# Patient Record
Sex: Male | Born: 1955 | Race: Black or African American | Hispanic: No | Marital: Married | Smoking: Never smoker
Health system: Southern US, Community
[De-identification: ages and names within clinical notes are randomized; demographics above are authoritative.]

---

## 2009-02-07 ENCOUNTER — Emergency Department (HOSPITAL_BASED_OUTPATIENT_CLINIC_OR_DEPARTMENT_OTHER): Admission: EM | Admit: 2009-02-07 | Discharge: 2009-02-07 | Payer: Self-pay | Admitting: Emergency Medicine

## 2015-12-16 ENCOUNTER — Emergency Department (HOSPITAL_COMMUNITY): Payer: Medicaid Other

## 2015-12-16 ENCOUNTER — Encounter (HOSPITAL_COMMUNITY): Payer: Self-pay

## 2015-12-16 ENCOUNTER — Emergency Department (HOSPITAL_COMMUNITY)
Admission: EM | Admit: 2015-12-16 | Discharge: 2015-12-16 | Disposition: A | Discharge status: Home / Self Care | Payer: Medicaid Other | Attending: Emergency Medicine | Admitting: Emergency Medicine

## 2015-12-16 DIAGNOSIS — Z79899 Other long term (current) drug therapy: Secondary | ICD-10-CM | POA: Diagnosis not present

## 2015-12-16 DIAGNOSIS — R079 Chest pain, unspecified: Secondary | ICD-10-CM | POA: Diagnosis present

## 2015-12-16 DIAGNOSIS — R0789 Other chest pain: Secondary | ICD-10-CM | POA: Diagnosis not present

## 2015-12-16 DIAGNOSIS — F1010 Alcohol abuse, uncomplicated: Secondary | ICD-10-CM | POA: Diagnosis not present

## 2015-12-16 DIAGNOSIS — R1031 Right lower quadrant pain: Secondary | ICD-10-CM | POA: Diagnosis not present

## 2015-12-16 LAB — RAPID URINE DRUG SCREEN, HOSP PERFORMED
Amphetamines: NOT DETECTED
BARBITURATES: NOT DETECTED
BENZODIAZEPINES: NOT DETECTED
COCAINE: NOT DETECTED
Opiates: NOT DETECTED
TETRAHYDROCANNABINOL: POSITIVE — AB

## 2015-12-16 LAB — ETHANOL: Alcohol, Ethyl (B): 225 mg/dL — ABNORMAL HIGH (ref <5)

## 2015-12-16 LAB — URINALYSIS, ROUTINE W REFLEX MICROSCOPIC
Bilirubin Urine: NEGATIVE
GLUCOSE, UA: NEGATIVE mg/dL
HGB URINE DIPSTICK: NEGATIVE
Ketones, ur: NEGATIVE mg/dL
Leukocytes, UA: NEGATIVE
Nitrite: NEGATIVE
PROTEIN: NEGATIVE mg/dL
Specific Gravity, Urine: 1.013 (ref 1.005–1.030)
pH: 5 (ref 5.0–8.0)

## 2015-12-16 LAB — BASIC METABOLIC PANEL
ANION GAP: 14 (ref 5–15)
BUN: 10 mg/dL (ref 6–20)
CO2: 16 mmol/L — ABNORMAL LOW (ref 22–32)
Calcium: 7.8 mg/dL — ABNORMAL LOW (ref 8.9–10.3)
Chloride: 110 mmol/L (ref 101–111)
Creatinine, Ser: 1.01 mg/dL (ref 0.61–1.24)
GFR calc Af Amer: >60 mL/min (ref >60)
Glucose, Bld: 83 mg/dL (ref 65–99)
POTASSIUM: 4.1 mmol/L (ref 3.5–5.1)
SODIUM: 140 mmol/L (ref 135–145)

## 2015-12-16 LAB — HEPATIC FUNCTION PANEL
ALT: 95 U/L — AB (ref 17–63)
AST: 149 U/L — AB (ref 15–41)
Albumin: 3.3 g/dL — ABNORMAL LOW (ref 3.5–5.0)
Alkaline Phosphatase: 52 U/L (ref 38–126)
BILIRUBIN INDIRECT: 0.2 mg/dL — AB (ref 0.3–0.9)
Bilirubin, Direct: 0.4 mg/dL (ref 0.1–0.5)
TOTAL PROTEIN: 6.4 g/dL — AB (ref 6.5–8.1)
Total Bilirubin: 0.6 mg/dL (ref 0.3–1.2)

## 2015-12-16 LAB — I-STAT TROPONIN, ED
Troponin i, poc: 0 ng/mL (ref 0.00–0.08)
Troponin i, poc: 0 ng/mL (ref 0.00–0.08)

## 2015-12-16 LAB — CBC
HEMATOCRIT: 42.7 % (ref 39.0–52.0)
HEMOGLOBIN: 14.6 g/dL (ref 13.0–17.0)
MCH: 32.2 pg (ref 26.0–34.0)
MCHC: 34.2 g/dL (ref 30.0–36.0)
MCV: 94.3 fL (ref 78.0–100.0)
Platelets: 158 10*3/uL (ref 150–400)
RBC: 4.53 MIL/uL (ref 4.22–5.81)
RDW: 12.8 % (ref 11.5–15.5)
WBC: 6.3 10*3/uL (ref 4.0–10.5)

## 2015-12-16 LAB — LIPASE, BLOOD: Lipase: 21 U/L (ref 11–51)

## 2015-12-16 MED ORDER — SODIUM CHLORIDE 0.9 % IV BOLUS (SEPSIS)
1000.0000 mL | Freq: Once | INTRAVENOUS | Status: AC
  Administered 2015-12-16: 1000 mL via INTRAVENOUS

## 2015-12-16 MED ORDER — IOPAMIDOL (ISOVUE-300) INJECTION 61%
INTRAVENOUS | Status: AC
Start: 2015-12-16 — End: 2015-12-16
  Administered 2015-12-16: 100 mL
  Filled 2015-12-16: qty 100

## 2015-12-16 MED ORDER — ONDANSETRON HCL 4 MG/2ML IJ SOLN
4.0000 mg | Freq: Once | INTRAMUSCULAR | Status: AC
  Administered 2015-12-16: 4 mg via INTRAVENOUS
  Filled 2015-12-16: qty 2

## 2015-12-16 MED ORDER — SODIUM CHLORIDE 0.9 % IV BOLUS (SEPSIS)
500.0000 mL | Freq: Once | INTRAVENOUS | Status: AC
  Administered 2015-12-16: 500 mL via INTRAVENOUS

## 2015-12-16 NOTE — Discharge Instructions (Signed)
Drink plenty of fluids. Try to stop drinking alcohol. Look at the outpatient referrals to get help to stop drinking.

## 2015-12-16 NOTE — ED Notes (Signed)
Patient transported to CT 

## 2015-12-16 NOTE — ED Notes (Signed)
Called main lab to add on hfp and lipase.

## 2015-12-16 NOTE — ED Triage Notes (Addendum)
Pt brought in by [facility]. Pt is ETOH with c/o abdominal pain, chest pain, and nausea vomiting. Pt is alert and responding to some questions. Family reports pt also smoking marijuana. Per family pt started complaining of pain at about 0000.

## 2015-12-16 NOTE — ED Provider Notes (Signed)
[facility]-EMERGENCY DEPT Provider Note   CSN: [id number] Arrival date & time: 12/16/15  0207  By signing my name below, I, Linus GalasMaharshi Patel, attest that this documentation has been prepared under the direction and in the presence of Devoria AlbeIva Tyvion Edmondson, MD. Electronically Signed: Linus GalasMaharshi Patel, ED Scribe. 12/16/15. 2:37 AM.  Time seen 02:37 AM  History   Chief Complaint Chief Complaint  Patient presents with  . Abdominal Pain  . Chest Pain   The history is provided by the patient. No language interpreter was used.   Level 5 caveat for alcohol intoxication  HPI Comments: Austin Hensley is a 60 y.o. male who presents to the Emergency Department complaining of chest pain with associated SOB that began 3 hours ago around [place]. Wife also reports mid-abdominal pain, urinating on himself and 4 episodes of vomiting. As per wife, while the pt was lying down in bed, he began complaining of CP and SOB. She states it seemed like the pt was gasping for air. Wife suspects the pt had too much alcohol to drink tonight. He usually drinks 12 beers and 1 bottle of wine daily. He also smoked marijuana today.  Wife denies any fevers, cough,chills, diarrhea, HA, dizziness, or any other symptoms at this time. He has no hx of prior abdominal surgeries. Wife states they discussed detox once about a  Year ago and the patient refused to go. Pt states tonight "If I survive I am going to quit today".    PCP  OSEI-BONSU,GEORGE, MD  History reviewed. No pertinent past medical history.  There are no active problems to display for this patient.  History reviewed. No pertinent surgical history.  Home Medications    Prior to Admission medications   Not on File   Family History History reviewed. No pertinent family history.  Social History Social History  Substance Use Topics  . Smoking status: Never Smoker  . Smokeless tobacco: Never Used  . Alcohol use Not on file  Pt does yard work on the side. Drinks 12 beers + 1  bottle wine daily unemployed  Allergies   Patient has no known allergies.  Review of Systems Review of Systems  Constitutional: Negative for chills and fever.  Respiratory: Positive for shortness of breath.   Cardiovascular: Positive for chest pain.  Gastrointestinal: Positive for vomiting. Negative for diarrhea.  Neurological: Negative for dizziness and headaches.  All other systems reviewed and are negative.  Physical Exam Updated Vital Signs BP 130/86 (BP Location: Right Arm)   Pulse 70   Resp 18   SpO2 95%   Vital signs normal    Physical Exam  Constitutional: He is oriented to person, place, and time. He appears well-developed and well-nourished.  Non-toxic appearance. He does not appear ill. No distress.  somnolent   HENT:  Head: Normocephalic and atraumatic.  Right Ear: External ear normal.  Left Ear: External ear normal.  Nose: Nose normal. No mucosal edema or rhinorrhea.  Mouth/Throat: Oropharynx is clear and moist and mucous membranes are normal. No dental abscesses or uvula swelling.  Eyes: Conjunctivae and EOM are normal. Pupils are equal, round, and reactive to light.  Neck: Normal range of motion and full passive range of motion without pain. Neck supple.  Cardiovascular: Normal rate, regular rhythm and normal heart sounds.  Exam reveals no gallop and no friction rub.   No murmur heard. Pulmonary/Chest: Effort normal and breath sounds normal. No respiratory distress. He has no wheezes. He has no rhonchi. He has no rales. He  exhibits no tenderness and no crepitus.  Abdominal: Soft. Normal appearance and bowel sounds are normal. He exhibits no distension. There is tenderness in the right lower quadrant. There is no rebound and no guarding.    Musculoskeletal: Normal range of motion. He exhibits no edema or tenderness.  Moves all extremities well.   Neurological: He is alert and oriented to person, place, and time. He has normal strength. No cranial nerve  deficit.  Skin: Skin is warm, dry and intact. No rash noted. No erythema. No pallor.  Psychiatric: He has a normal mood and affect. His mood appears not anxious. His speech is delayed. He is slowed.  Nursing note and vitals reviewed.  ED Treatments / Results  DIAGNOSTIC STUDIES: Oxygen Saturation is 95% on room air, normal by my interpretation.    COORDINATION OF CARE: 2:50 AM Discussed treatment plan with pt at bedside and pt agreed to plan.  Labs (all labs ordered are listed, but only abnormal results are displayed) Results for orders placed or performed during the hospital encounter of 12/16/15  Basic metabolic panel  Result Value Ref Range   Sodium 140 135 - 145 mmol/L   Potassium 4.1 3.5 - 5.1 mmol/L   Chloride 110 101 - 111 mmol/L   CO2 16 (L) 22 - 32 mmol/L   Glucose, Bld 83 65 - 99 mg/dL   BUN 10 6 - 20 mg/dL   Creatinine, Ser 1.611.01 0.61 - 1.24 mg/dL   Calcium 7.8 (L) 8.9 - 10.3 mg/dL   GFR calc non Af Amer >60 >60 mL/min   GFR calc Af Amer >60 >60 mL/min   Anion gap 14 5 - 15  CBC  Result Value Ref Range   WBC 6.3 4.0 - 10.5 K/uL   RBC 4.53 4.22 - 5.81 MIL/uL   Hemoglobin 14.6 13.0 - 17.0 g/dL   HCT 09.642.7 04.539.0 - 40.952.0 %   MCV 94.3 78.0 - 100.0 fL   MCH 32.2 26.0 - 34.0 pg   MCHC 34.2 30.0 - 36.0 g/dL   RDW 81.112.8 91.411.5 - 78.215.5 %   Platelets 158 150 - 400 K/uL  Hepatic function panel  Result Value Ref Range   Total Protein 6.4 (L) 6.5 - 8.1 g/dL   Albumin 3.3 (L) 3.5 - 5.0 g/dL   AST 956149 (H) 15 - 41 U/L   ALT 95 (H) 17 - 63 U/L   Alkaline Phosphatase 52 38 - 126 U/L   Total Bilirubin 0.6 0.3 - 1.2 mg/dL   Bilirubin, Direct 0.4 0.1 - 0.5 mg/dL   Indirect Bilirubin 0.2 (L) 0.3 - 0.9 mg/dL  Ethanol  Result Value Ref Range   Alcohol, Ethyl (B) 225 (H) <5 mg/dL  Urine rapid drug screen (hosp performed)  Result Value Ref Range   Opiates NONE DETECTED NONE DETECTED   Cocaine NONE DETECTED NONE DETECTED   Benzodiazepines NONE DETECTED NONE DETECTED   Amphetamines  NONE DETECTED NONE DETECTED   Tetrahydrocannabinol POSITIVE (A) NONE DETECTED   Barbiturates NONE DETECTED NONE DETECTED  Urinalysis, Routine w reflex microscopic  Result Value Ref Range   Color, Urine YELLOW YELLOW   APPearance CLEAR CLEAR   Specific Gravity, Urine 1.013 1.005 - 1.030   pH 5.0 5.0 - 8.0   Glucose, UA NEGATIVE NEGATIVE mg/dL   Hgb urine dipstick NEGATIVE NEGATIVE   Bilirubin Urine NEGATIVE NEGATIVE   Ketones, ur NEGATIVE NEGATIVE mg/dL   Protein, ur NEGATIVE NEGATIVE mg/dL   Nitrite NEGATIVE NEGATIVE   Leukocytes, UA  NEGATIVE NEGATIVE  Lipase, blood  Result Value Ref Range   Lipase 21 11 - 51 U/L  I-stat troponin, ED  Result Value Ref Range   Troponin i, poc 0.00 0.00 - 0.08 ng/mL   Comment 3          I-stat troponin, ED  Result Value Ref Range   Troponin i, poc 0.00 0.00 - 0.08 ng/mL   Comment 3           Laboratory interpretation all normal except Alcohol intoxication, elevated LFTs consistent with alcohol use    EKG  EKG Interpretation  Date/Time:  Wednesday December 16 2015 02:19:40 EST Ventricular Rate:  59 PR Interval:    QRS Duration: 111 QT Interval:  464 QTC Calculation: 460 R Axis:   82 Text Interpretation:  Sinus rhythm Probable left ventricular hypertrophy early repolarization No old tracing to compare Confirmed by Shylin Keizer  MD-I, Adalina Dopson ([id number]) on 12/16/2015 2:27:16 AM       Radiology Dg Chest 2 View  Result Date: 12/16/2015 CLINICAL DATA:  Mid chest pain and shortness of breath EXAM: CHEST  2 VIEW COMPARISON:  None. FINDINGS: The heart size and mediastinal contours are within normal limits. Both lungs are clear. The visualized skeletal structures are unremarkable. IMPRESSION: No active cardiopulmonary disease. Electronically Signed   By: Jasmine Pang M.D.   On: 12/16/2015 03:05   Ct Abdomen Pelvis W Contrast  Result Date: 12/16/2015 CLINICAL DATA:  Acute onset of right lower quadrant abdominal pain. Initial encounter. EXAM: CT ABDOMEN  AND PELVIS WITH CONTRAST TECHNIQUE: Multidetector CT imaging of the abdomen and pelvis was performed using the standard protocol following bolus administration of intravenous contrast. CONTRAST:  ISOVUE-300 IOPAMIDOL (ISOVUE-300) INJECTION 61% COMPARISON:  None. FINDINGS: Lower chest: Mild bibasilar atelectasis is noted. The visualized portions of the mediastinum are unremarkable. Hepatobiliary: The liver is unremarkable in appearance. The gallbladder is unremarkable in appearance. The common bile duct remains normal in caliber. Nodes near the porta hepatis measure up to 1.5 cm in short axis. Pancreas: The pancreas is within normal limits. Spleen: The spleen is unremarkable in appearance. Adrenals/Urinary Tract: The adrenal glands are unremarkable in appearance. The kidneys are within normal limits. There is no evidence of hydronephrosis. No renal or ureteral stones are identified. No perinephric stranding is seen. Stomach/Bowel: The stomach is unremarkable in appearance. The small bowel is within normal limits. The appendix is normal in caliber, without evidence of appendicitis. The colon is unremarkable in appearance. Vascular/Lymphatic: Minimal calcification is noted at the distal abdominal aorta. There is mild ectasia of the common iliac arteries bilaterally. No retroperitoneal or pelvic sidewall lymphadenopathy is seen. Prominent vasculature is noted extending about the prostate and base of the bladder. Reproductive: The bladder is significantly distended and grossly unremarkable. The prostate is borderline normal in size, with scattered calcification. Other: No additional soft tissue abnormalities are seen. Musculoskeletal: No acute osseous abnormalities are identified. There is mild grade 1 anterolisthesis of L2 on L3. Slight chronic loss of height is noted along the lower lumbar spine, with underlying vacuum phenomenon. The visualized musculature is unremarkable in appearance. IMPRESSION: 1. No acute  abnormality seen to explain the patient's symptoms. 2. Nodes near the porta hepatis measure up to 1.5 cm in short axis, of uncertain significance. 3. Prominent vasculature incidentally noted extending about the prostate and base of the bladder. 4. Mild bibasilar atelectasis noted. Electronically Signed   By: Roanna Raider M.D.   On: 12/16/2015 05:17    Procedures  Procedures (including critical care time)  Medications Ordered in ED Medications  sodium chloride 0.9 % bolus 1,000 mL (0 mLs Intravenous Stopped 12/16/15 0445)  sodium chloride 0.9 % bolus 500 mL (0 mLs Intravenous Stopped 12/16/15 0322)  ondansetron (ZOFRAN) injection 4 mg (4 mg Intravenous Given 12/16/15 0311)  iopamidol (ISOVUE-300) 61 % injection (100 mLs  Contrast Given 12/16/15 0450)     Initial Impression / Assessment and Plan / ED Course  I have reviewed the triage vital signs and the nursing notes.  Pertinent labs & imaging results that were available during my care of the patient were reviewed by me and considered in my medical decision making (see chart for details).  Clinical Course    Patient was given IV fluids. He was given IV Zofran for nausea. Laboratory testing was ordered. We discussed his abdominal pain and I will recheck him in a little while to see if his pain is persistent.   04:25 AM reexam, patient sleeping, when awakened, still has abdominal pain. On exam he has RLQ pain to palpation. Discussed getting AP CT scan to look for appendicitis/colitis.  5 AM recheck patient was given results of his CT scan we discussed getting a delta troponin and if it's negative he can be discharged home.  6:30 AM patient's delta troponin is negative. He was discharged home. He was given outpatient referral for alcohol abuse in case he is serious about stopping drinking. Due to his heavy alcohol use he was not given any specific medications for pain.  Final Clinical Impressions(s) / ED Diagnoses   Final diagnoses:    Alcohol abuse  Right lower quadrant abdominal pain  Chest pain, atypical    Plan discharge  Devoria Albe, MD, FACEP   I personally performed the services described in this documentation, which was scribed in my presence. The recorded information has been reviewed and considered.  Devoria Albe, MD, Concha Pyo, MD 12/16/15 [phone]

## 2017-10-28 IMAGING — CT CT ABD-PELV W/ CM
2 of 5 series · 16 of 46 positions shown, 18 images · IV contrast (Omni 300)
Comparison: None.

CLINICAL DATA: Acute onset of right lower quadrant abdominal pain.
Initial encounter.

EXAM:
CT ABDOMEN AND PELVIS WITH CONTRAST
TECHNIQUE: Multidetector CT imaging of the abdomen and pelvis was performed
using the standard protocol following bolus administration of
intravenous contrast.
CONTRAST:  100mL GAHTIJ-K33 IOPAMIDOL (GAHTIJ-K33) INJECTION 61%

[Series 2: a/p w/ 5mm · axial · 0.77mm/px · z∈[-461,-76]mm · 13 of 87 slices shown, 15 images]
[im 5/87  soft-tissue]
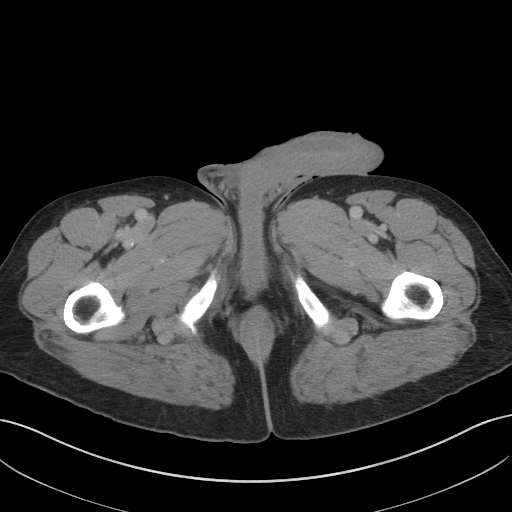
[im 5/87  bone]
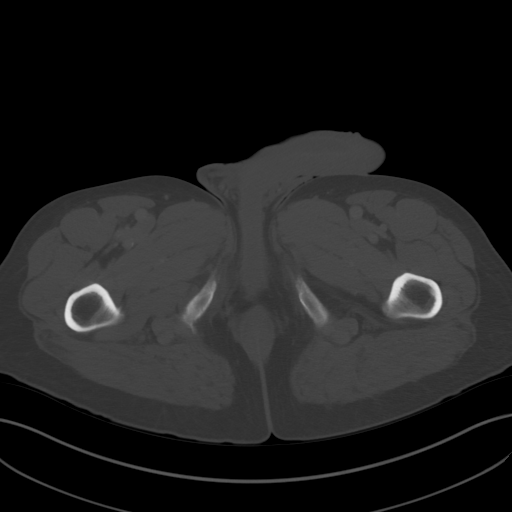
[im 10/87  soft-tissue]
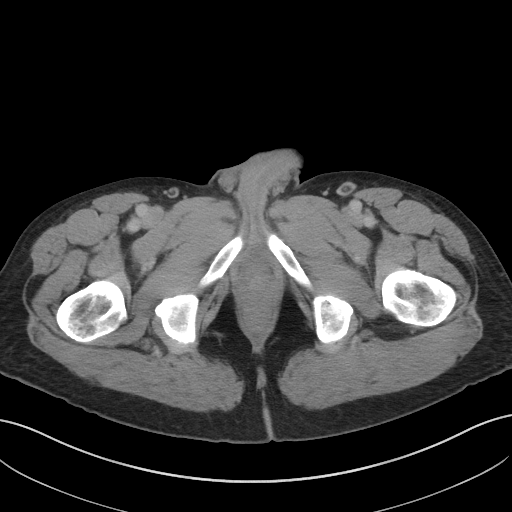
[im 20/87  soft-tissue]
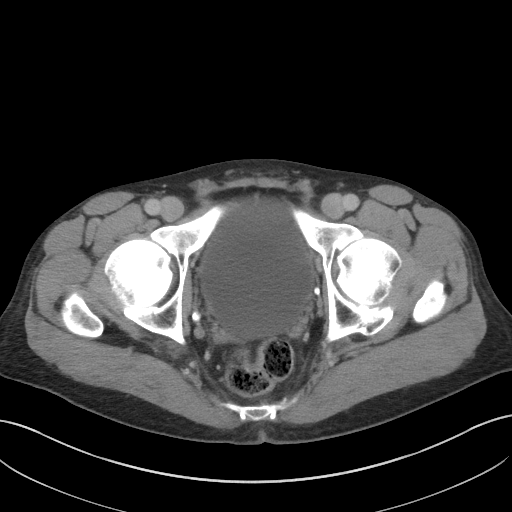
[im 24/87  soft-tissue]
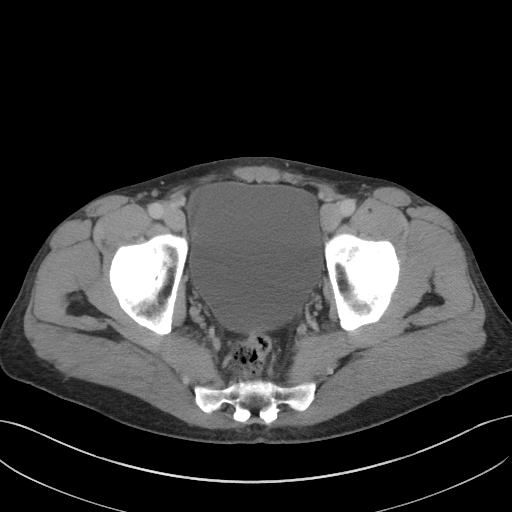
[im 29/87  soft-tissue]
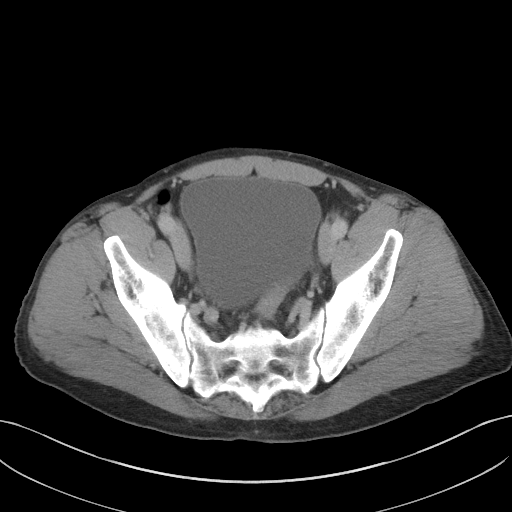
[im 39/87  soft-tissue]
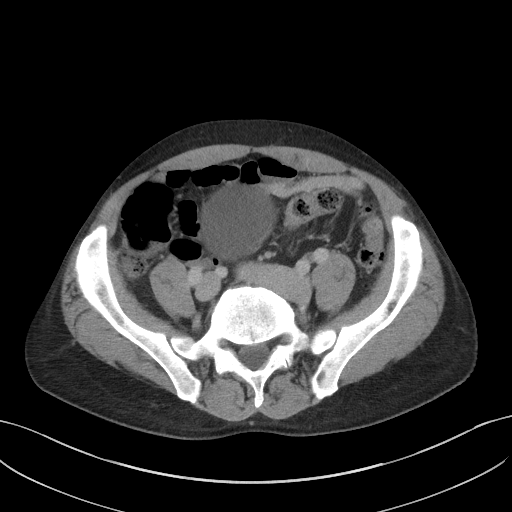
[im 44/87  soft-tissue]
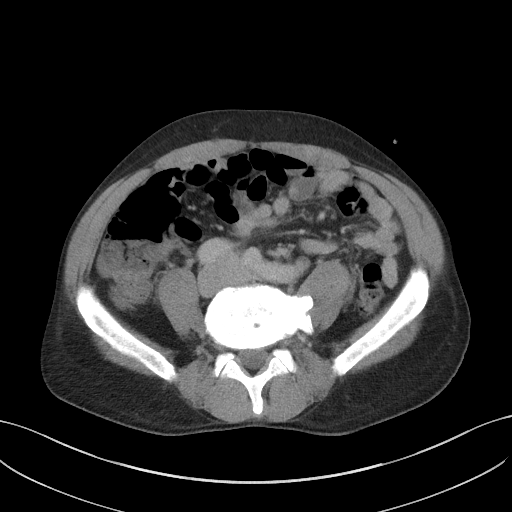
[im 48/87  soft-tissue]
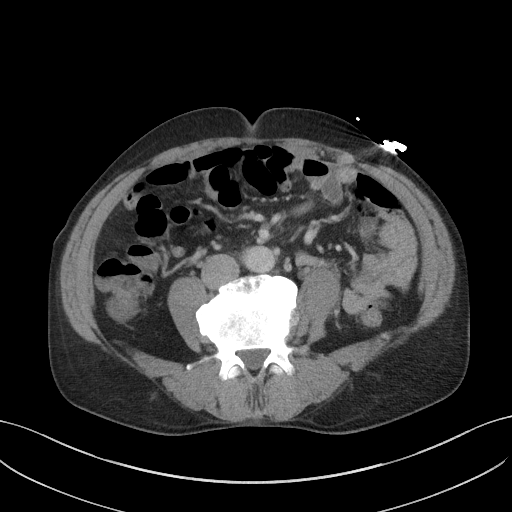
[im 58/87  soft-tissue]
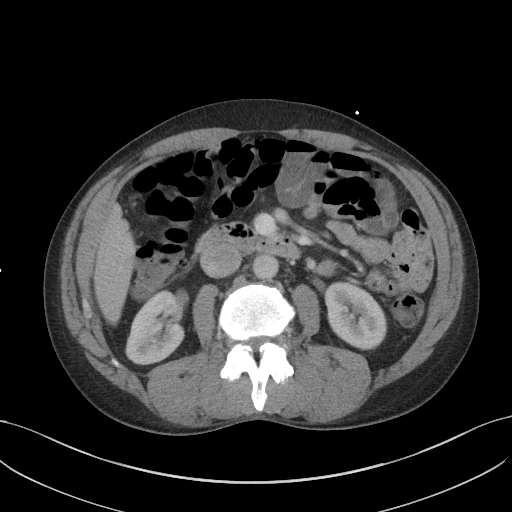
[im 58/87  bone]
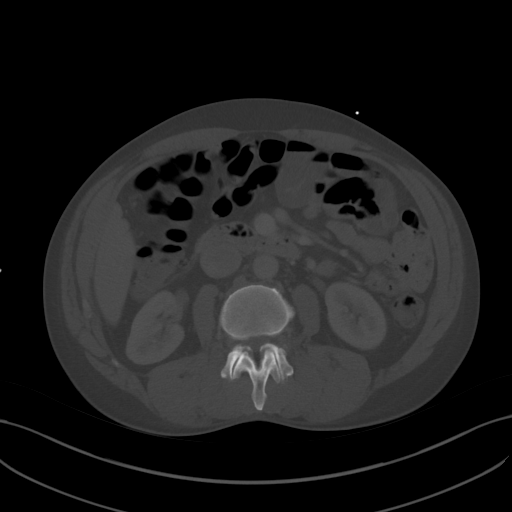
[im 63/87  soft-tissue]
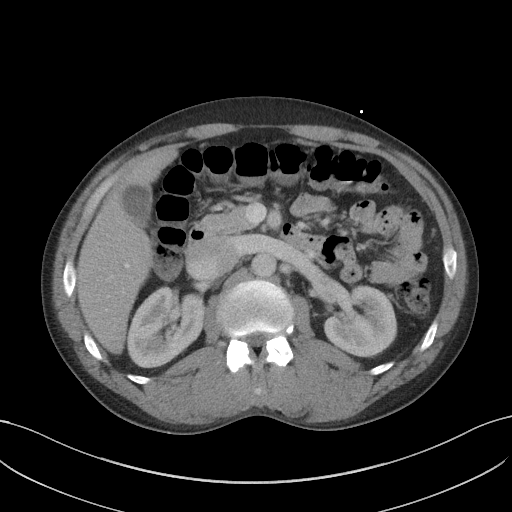
[im 67/87  soft-tissue]
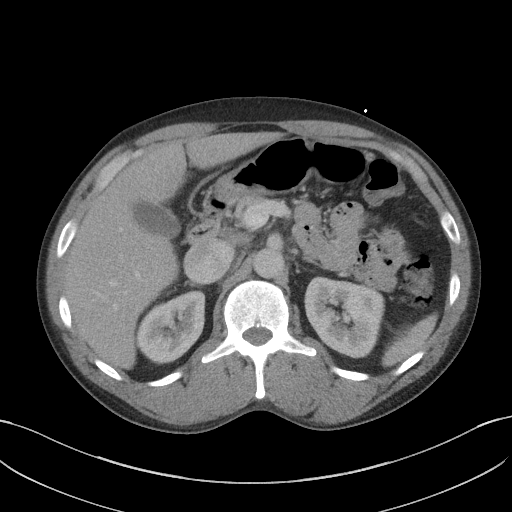
[im 77/87  soft-tissue]
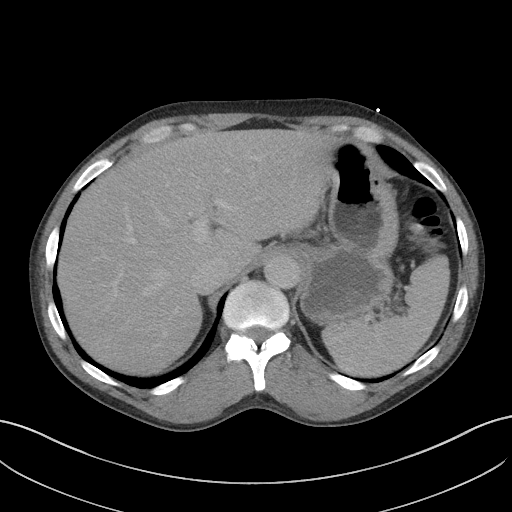
[im 82/87  soft-tissue]
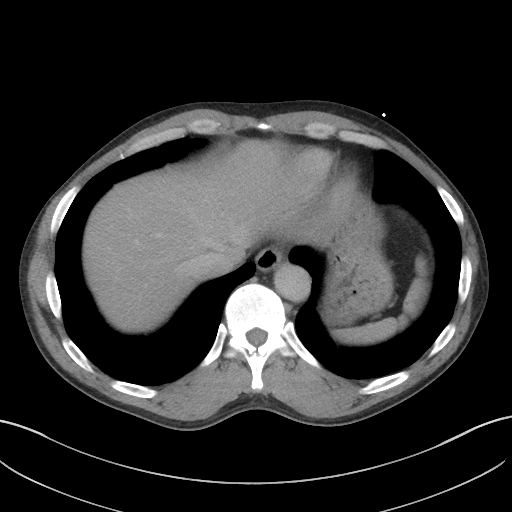

[Series 5: a/p w/ cor · coronal · 0.75mm/px · 3 of 122 slices shown]
[im 41/122  soft-tissue]
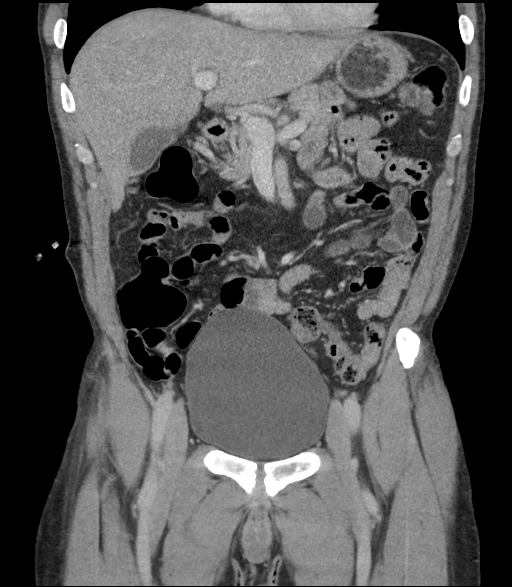
[im 54/122  soft-tissue]
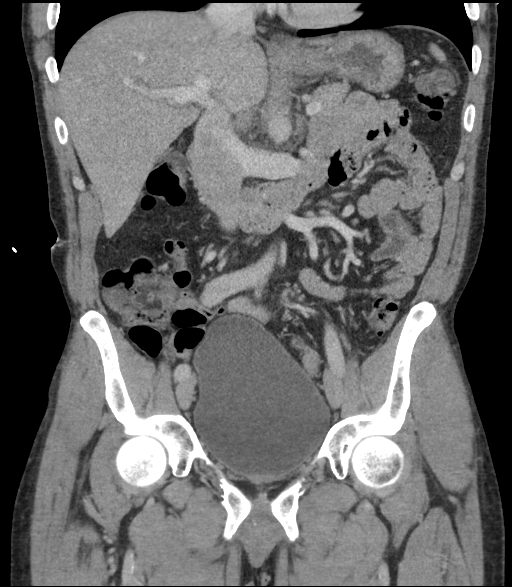
[im 68/122  soft-tissue]
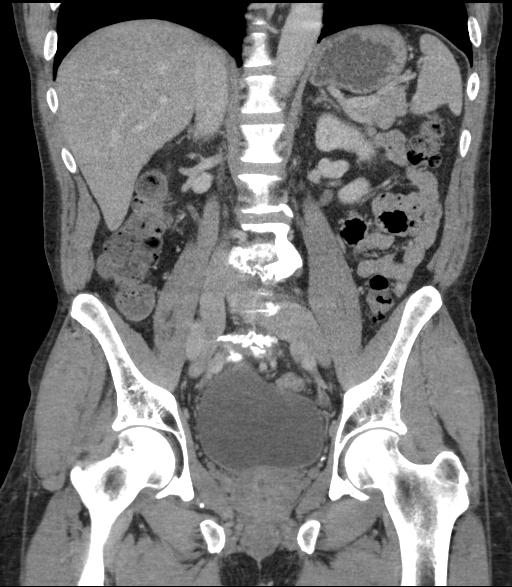

[16 of 46 positions shown; findings below may reference images not displayed]

FINDINGS: Lower chest: Mild bibasilar atelectasis is noted. The visualized
portions of the mediastinum are unremarkable.

Hepatobiliary: The liver is unremarkable in appearance. The
gallbladder is unremarkable in appearance. The common bile duct
remains normal in caliber.

Nodes near the porta hepatis measure up to 1.5 cm in short axis.

Pancreas: The pancreas is within normal limits.

Spleen: The spleen is unremarkable in appearance.

Adrenals/Urinary Tract: The adrenal glands are unremarkable in
appearance. The kidneys are within normal limits. There is no
evidence of hydronephrosis. No renal or ureteral stones are
identified. No perinephric stranding is seen.

Stomach/Bowel: The stomach is unremarkable in appearance. The small
bowel is within normal limits. The appendix is normal in caliber,
without evidence of appendicitis. The colon is unremarkable in
appearance.

Vascular/Lymphatic: Minimal calcification is noted at the distal
abdominal aorta. There is mild ectasia of the common iliac arteries
bilaterally. No retroperitoneal or pelvic sidewall lymphadenopathy
is seen.

Prominent vasculature is noted extending about the prostate and base
of the bladder.

Reproductive: The bladder is significantly distended and grossly
unremarkable. The prostate is borderline normal in size, with
scattered calcification.

Other: No additional soft tissue abnormalities are seen.

Musculoskeletal: No acute osseous abnormalities are identified.
There is mild grade 1 anterolisthesis of L2 on L3. Slight chronic
loss of height is noted along the lower lumbar spine, with
underlying vacuum phenomenon. The visualized musculature is
unremarkable in appearance.
IMPRESSION: 1. No acute abnormality seen to explain the patient's symptoms.
2. Nodes near the porta hepatis measure up to 1.5 cm in short axis,
of uncertain significance.
3. Prominent vasculature incidentally noted extending about the
prostate and base of the bladder.
4. Mild bibasilar atelectasis noted.
